# Patient Record
Sex: Male | Born: 1975 | Race: White | Hispanic: No | Marital: Single | Smoking: Current every day smoker
Health system: Southern US, Community
[De-identification: ages and names within clinical notes are randomized; demographics above are authoritative.]

## PROBLEM LIST (undated history)

## (undated) DIAGNOSIS — F32A Depression, unspecified: Secondary | ICD-10-CM

## (undated) DIAGNOSIS — F419 Anxiety disorder, unspecified: Secondary | ICD-10-CM

## (undated) DIAGNOSIS — I10 Essential (primary) hypertension: Secondary | ICD-10-CM

## (undated) HISTORY — DX: Depression, unspecified: F32.A

## (undated) HISTORY — PX: HERNIA REPAIR: SHX51

## (undated) HISTORY — DX: Anxiety disorder, unspecified: F41.9

---

## 2012-10-29 ENCOUNTER — Emergency Department: Payer: Self-pay | Admitting: Emergency Medicine

## 2012-10-29 LAB — COMPREHENSIVE METABOLIC PANEL
Anion Gap: 9 (ref 7–16)
BUN: 12 mg/dL (ref 7–18)
Bilirubin,Total: 0.5 mg/dL (ref 0.2–1.0)
Chloride: 102 mmol/L (ref 98–107)
Co2: 25 mmol/L (ref 21–32)
Creatinine: 0.64 mg/dL (ref 0.60–1.30)
EGFR (African American): >60
EGFR (Non-African Amer.): >60
Osmolality: 272 (ref 275–301)
Potassium: 3.8 mmol/L (ref 3.5–5.1)
SGPT (ALT): 53 U/L (ref 12–78)
Sodium: 136 mmol/L (ref 136–145)
Total Protein: 7.6 g/dL (ref 6.4–8.2)

## 2012-10-29 LAB — CBC
HCT: 44.4 % (ref 40.0–52.0)
MCV: 91 fL (ref 80–100)
Platelet: 297 10*3/uL (ref 150–440)
RBC: 4.87 10*6/uL (ref 4.40–5.90)
RDW: 13.1 % (ref 11.5–14.5)
WBC: 8.3 10*3/uL (ref 3.8–10.6)

## 2012-10-29 LAB — CK TOTAL AND CKMB (NOT AT ARMC)
CK, Total: 197 U/L (ref 35–232)
CK, Total: 192 U/L (ref 35–232)
CK-MB: 1.8 ng/mL (ref 0.5–3.6)

## 2013-02-21 ENCOUNTER — Inpatient Hospital Stay: Payer: Self-pay | Admitting: Family Medicine

## 2013-02-21 LAB — CBC
HCT: 44.8 % (ref 40.0–52.0)
HGB: 15.4 g/dL (ref 13.0–18.0)
MCH: 30.2 pg (ref 26.0–34.0)
MCV: 88 fL (ref 80–100)
Platelet: 242 10*3/uL (ref 150–440)
RBC: 5.1 10*6/uL (ref 4.40–5.90)
RDW: 13.9 % (ref 11.5–14.5)
WBC: 12.6 10*3/uL — ABNORMAL HIGH (ref 3.8–10.6)

## 2013-02-21 LAB — BASIC METABOLIC PANEL
Anion Gap: 7 (ref 7–16)
Chloride: 104 mmol/L (ref 98–107)
Co2: 28 mmol/L (ref 21–32)
EGFR (African American): >60
EGFR (Non-African Amer.): >60
Glucose: 126 mg/dL — ABNORMAL HIGH (ref 65–99)
Osmolality: 278 (ref 275–301)
Potassium: 3.2 mmol/L — ABNORMAL LOW (ref 3.5–5.1)
Sodium: 139 mmol/L (ref 136–145)

## 2013-02-21 LAB — CK TOTAL AND CKMB (NOT AT ARMC)
CK, Total: 317 U/L — ABNORMAL HIGH (ref 35–232)
CK-MB: 7.3 ng/mL — ABNORMAL HIGH (ref 0.5–3.6)

## 2013-02-21 LAB — TROPONIN I: Troponin-I: 2.76 ng/mL — ABNORMAL HIGH

## 2013-02-22 LAB — CK TOTAL AND CKMB (NOT AT ARMC)
CK, Total: 231 U/L (ref 35–232)
CK, Total: 152 U/L (ref 35–232)
CK-MB: 3.8 ng/mL — ABNORMAL HIGH (ref 0.5–3.6)
CK-MB: 2.7 ng/mL (ref 0.5–3.6)

## 2013-02-22 LAB — CBC WITH DIFFERENTIAL/PLATELET
Basophil #: 0.1 10*3/uL (ref 0.0–0.1)
Basophil %: 0.8 %
Eosinophil #: 0.1 10*3/uL (ref 0.0–0.7)
HCT: 41.4 % (ref 40.0–52.0)
HGB: 14.1 g/dL (ref 13.0–18.0)
Lymphocyte #: 2.3 10*3/uL (ref 1.0–3.6)
Lymphocyte %: 31.4 %
Neutrophil %: 52.4 %
Platelet: 226 10*3/uL (ref 150–440)
RDW: 14.1 % (ref 11.5–14.5)
WBC: 7.4 10*3/uL (ref 3.8–10.6)

## 2013-02-22 LAB — BASIC METABOLIC PANEL
Anion Gap: 6 — ABNORMAL LOW (ref 7–16)
BUN: 10 mg/dL (ref 7–18)
Co2: 29 mmol/L (ref 21–32)
EGFR (African American): >60
Glucose: 97 mg/dL (ref 65–99)
Osmolality: 284 (ref 275–301)
Potassium: 3.4 mmol/L — ABNORMAL LOW (ref 3.5–5.1)

## 2013-02-22 LAB — TROPONIN I
Troponin-I: 1.69 ng/mL — ABNORMAL HIGH
Troponin-I: 0.89 ng/mL — ABNORMAL HIGH

## 2013-02-22 LAB — APTT
Activated PTT: 54.4 secs — ABNORMAL HIGH (ref 23.6–35.9)
Activated PTT: 32.8 secs (ref 23.6–35.9)

## 2013-02-22 LAB — SEDIMENTATION RATE: Erythrocyte Sed Rate: 45 mm/hr — ABNORMAL HIGH (ref 0–15)

## 2013-02-23 LAB — BASIC METABOLIC PANEL
Anion Gap: 8 (ref 7–16)
BUN: 10 mg/dL (ref 7–18)
Calcium, Total: 8.7 mg/dL (ref 8.5–10.1)
Chloride: 106 mmol/L (ref 98–107)
Creatinine: 0.57 mg/dL — ABNORMAL LOW (ref 0.60–1.30)
Osmolality: 278 (ref 275–301)
Sodium: 140 mmol/L (ref 136–145)

## 2013-02-23 LAB — CBC WITH DIFFERENTIAL/PLATELET
Eosinophil #: 0.1 10*3/uL (ref 0.0–0.7)
HCT: 39.5 % — ABNORMAL LOW (ref 40.0–52.0)
MCH: 29.9 pg (ref 26.0–34.0)
MCHC: 33.7 g/dL (ref 32.0–36.0)
MCV: 89 fL (ref 80–100)
Monocyte %: 12.3 %
Neutrophil %: 63.5 %

## 2013-03-14 ENCOUNTER — Emergency Department: Payer: Self-pay | Admitting: Emergency Medicine

## 2013-03-26 ENCOUNTER — Emergency Department: Payer: Self-pay | Admitting: Emergency Medicine

## 2013-06-04 ENCOUNTER — Encounter: Payer: Self-pay | Admitting: Nurse Practitioner

## 2013-06-04 ENCOUNTER — Encounter: Payer: Self-pay | Admitting: Cardiothoracic Surgery

## 2013-06-22 ENCOUNTER — Encounter: Payer: Self-pay | Admitting: Nurse Practitioner

## 2013-06-22 ENCOUNTER — Encounter: Payer: Self-pay | Admitting: Cardiothoracic Surgery

## 2013-12-21 IMAGING — CR DG CHEST 2V
1 series · 2 of 2 positions shown · non-contrast
Comparison: none

REASON FOR EXAM: Chest Pain
COMMENTS:

[Series 1: w chest pa · 0.14mm/px · 2 of 2 slices shown]
[im 1/2]
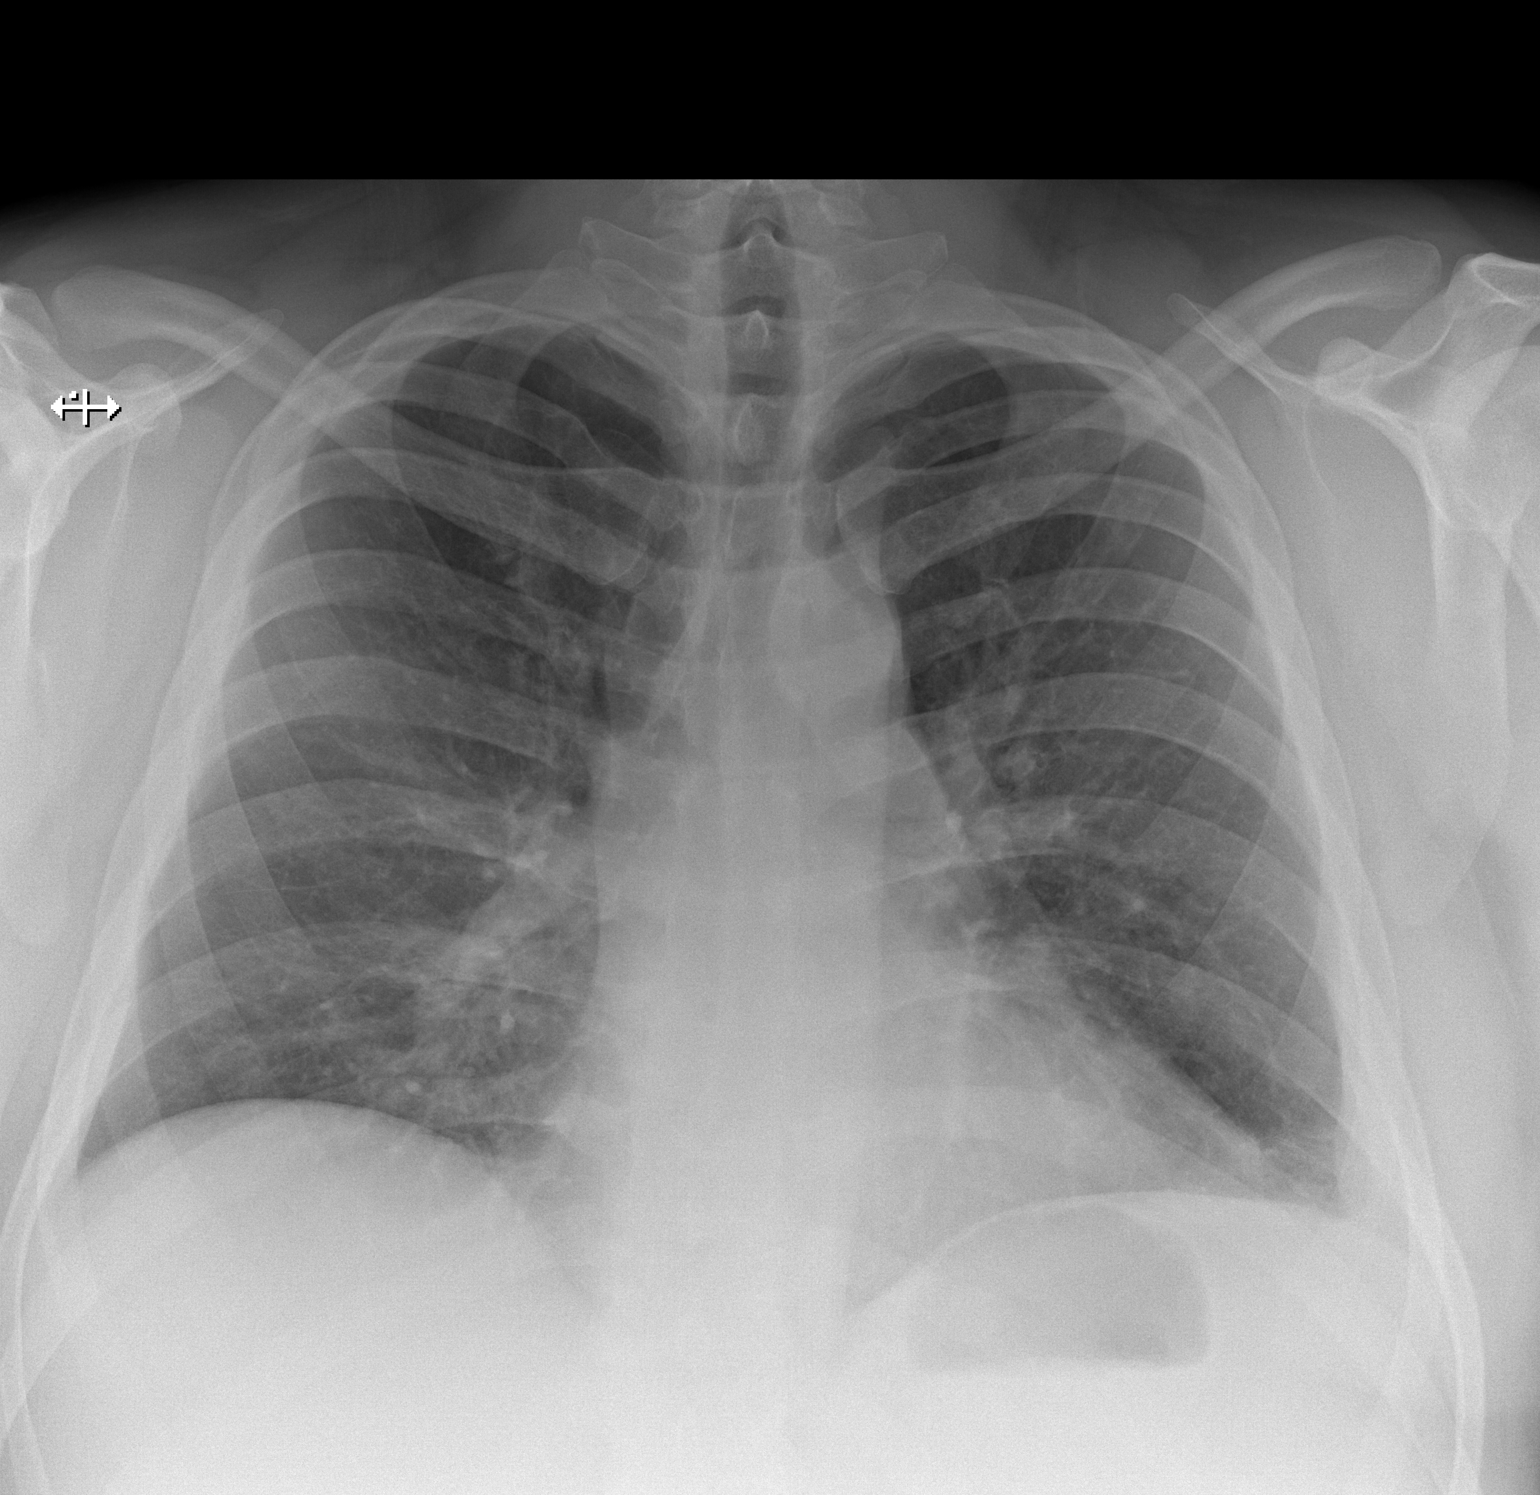
[im 2/2]
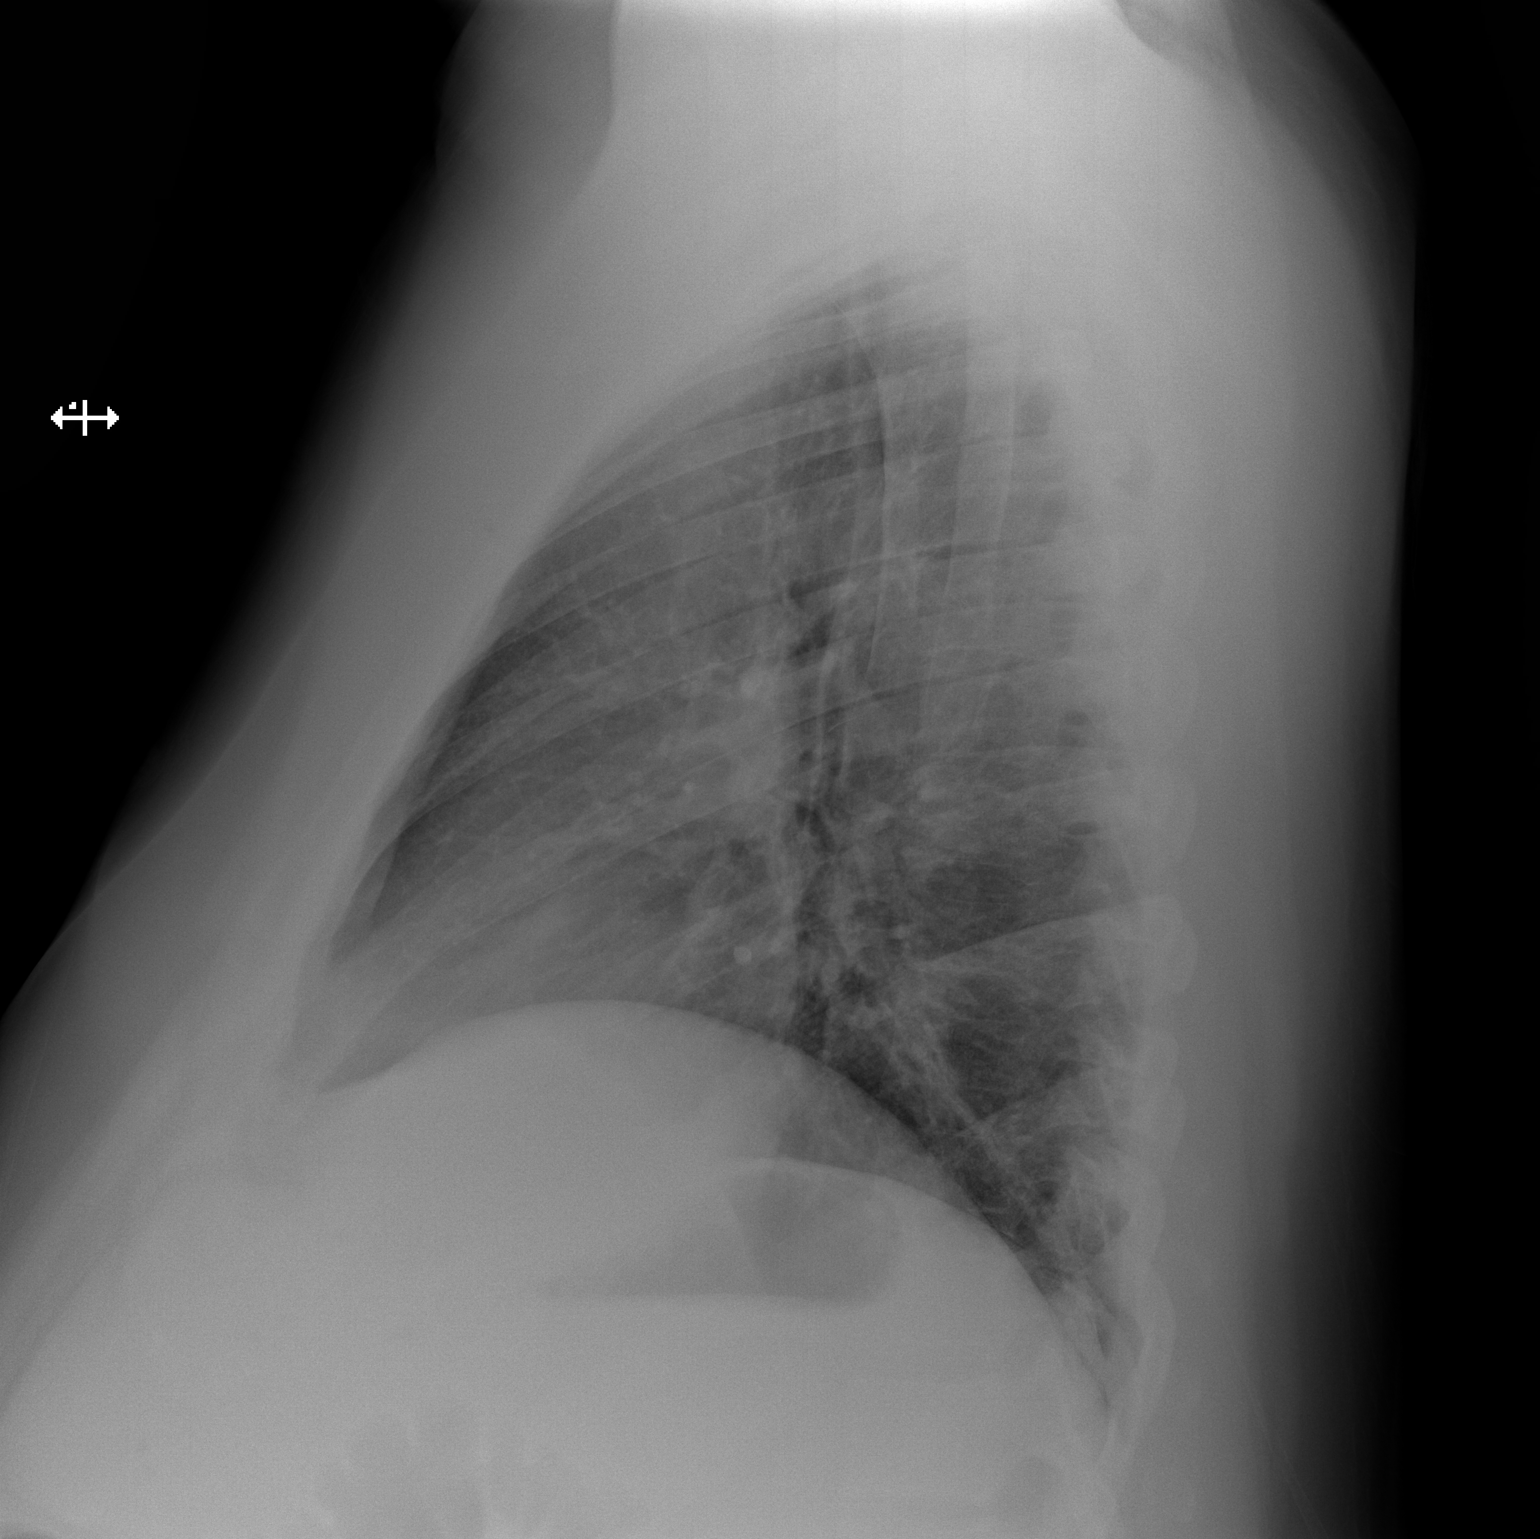

[2 of 2 positions shown; findings below may reference images not displayed]

PROCEDURE:     DXR - DXR CHEST PA (OR AP) AND LATERAL  - February 21, 2013  [DATE]

RESULT:     Comparison made to study October 29, 2012.

The lungs are adequately inflated. The perihilar lung markings are
increased. The central pulmonary vascularity is prominent. The cardiac
silhouette is normal in size. There is no pleural effusion or pneumothorax.
There is no alveolar infiltrate.
IMPRESSION: The findings may reflect low-grade interstitial edema of
cardiac or noncardiac cause. There is no alveolar pneumonia. There is no
evidence of a pneumothorax.

[REDACTED]

## 2014-12-31 ENCOUNTER — Ambulatory Visit: Payer: Self-pay | Admitting: Surgery

## 2015-01-01 ENCOUNTER — Ambulatory Visit: Payer: Self-pay | Admitting: Surgery

## 2015-01-07 ENCOUNTER — Ambulatory Visit: Payer: Self-pay | Admitting: Surgery

## 2015-03-14 NOTE — Discharge Summary (Signed)
PATIENT NAME:  [name], [name] MR#:  [id number] DATE OF BIRTH:  1976/02/09  DATE OF ADMISSION:  02/21/2013 DATE OF DISCHARGE: 02/23/2013   DISCHARGE DIAGNOSES:  1.  Chest discomfort with normal coronary arteries.  2.  Hypertension.  3.  Sleep apnea on CPAP.  4.  Gout.  5.  Anxiety/depression.   DISCHARGE MEDICATIONS:  1.  Flonase 50 mcg two sprays to each nostril daily.  2.  Risperdal 1 mg p.o. daily.  3.  Omeprazole 20 mg p.o. daily.  4.  Atenolol 50 mg p.o. daily.  5.  Citalopram 40 mg p.o. daily.  6.  Hydrochlorothiazide 25 mg p.o. daily.   CONSULTS: Cardiology per Dr. Lady GaryFath.   PROCEDURES: The patient underwent an echo that showed EF greater than 60%. He also underwent catheterization that showed normal coronary arteries.   LABORATORY, DIAGNOSTIC AND RADIOLOGICAL DATA: CK-MB of 3.8 transitioned to 2.7, troponin I 0.69 to 0.89. EKG did not show any significant changes. Sodium 140, potassium 3.6, creatinine 0.57. White blood cell count 8, hemoglobin 13.3, and platelets 259.   BRIEF HOSPITAL COURSE:  1.  Chest discomfort. The patient initially came in with chest discomfort with elevated troponins. His only risk factor was hypertension. He was evaluated by cardiology and underwent catheterization, which showed normal coronary arteries. He also had echocardiogram that showed normal LV function. Troponins were initially elevated, but did trend down. CK-MB was initially elevated and then started to trend down. He was asymptomatic on the next day. I do not think that this was a true cardiac event at this time. He will be followed as an outpatient per Dr. Dan HumphreysWalker. No further intervention at this time. No changes to his regimen.  2.  Other chronic medical issues remain stable at this time.   Discharged to home with followup Dr. Dan HumphreysWalker in 1 to 2 weeks.    ____________________________ Marisue IvanKanhka Daryle Amis, MD kl:aw D: 02/23/2013 08:32:58 ET T: 02/23/2013 08:40:02 ET JOB#: [id number]  cc: Marisue IvanKanhka  Kateryna Grantham, MD, <Dictator> John B. Danne HarborWalker, III, MD Marisue IvanKANHKA Musa Rewerts MD ELECTRONICALLY SIGNED 03/02/2013 10:01

## 2015-03-14 NOTE — H&P (Signed)
PATIENT NAME:  [name], CERASOLI MR#:  [id number] DATE OF BIRTH:  10-27-1976  DATE OF ADMISSION:  02/21/2013  REFERRING PHYSICIAN: Dr. Glenetta Hew.   FAMILY PHYSICIAN: Dr. Dan Humphreys.   REASON FOR ADMISSION: Chest pain with elevated troponin, consistent with non-ST elevation myocardial infarction.   HISTORY OF PRESENT ILLNESS: The patient is a 39 year old male with a significant history of anxiety/depression, sleep apnea, hypertension and gout. He was evaluated by Dr. Lady Gary in December with a routine stress test which was normal. Recently established with Dr. Dan Humphreys. He presents to the Emergency Room with chest pain associated with shortness of breath and diaphoresis. In the Emergency Room, the patient was noted to be tachycardic with diffuse non-ST elevation. There was some suggestion of an inferior infarct on EKG. His troponin returned elevated. He is currently chest pain-free and admitted for further evaluation.   PAST MEDICAL HISTORY: 1.  Benign hypertension.  2.  Sleep apnea, on CPAP.  3.  Gout.  4.  Anxiety/depression.  5.  Previous hernia repair.   MEDICATIONS: 1.  Atenolol 50 mg p.o. daily.  2.  Hydrochlorothiazide 25 mg p.o. daily.  3.  Omeprazole 20 mg p.o. daily.  4.  Risperdal 1 mg p.o. at bedtime.  5.  Celexa 40 mg p.o. daily.  6.  Flonase 2 puffs in each nostril daily.   ALLERGIES: VICODIN.   SOCIAL HISTORY: The patient is single, on disability because of his anxiety and depression. Smokes occasional cigars. No history of alcohol abuse.   FAMILY HISTORY: Positive coronary artery disease and "cancer." Otherwise unremarkable.   REVIEW OF SYSTEMS:  CONSTITUTIONAL: No fever or change in weight.  EYES: No blurred or double vision. No glaucoma.  ENT: No tinnitus or hearing loss.  NOSE:  No nasal discharge or bleeding.  THROAT:  No difficulty swallowing.  RESPIRATORY: No cough or wheezing. Denies hemoptysis.  CARDIOVASCULAR: No orthopnea. No palpitations or syncope.   GASTROINTESTINAL:  No nausea, vomiting or diarrhea. No abdominal pain. No change in bowel habits.  GENITOURINARY:  No dysuria or hematuria. No incontinence.  ENDOCRINE: No polyuria or polydipsia. No heat or cold intolerance.  HEMATOLOGIC: The patient denies anemia, easy bruising or bleeding.  LYMPHATIC: No swollen glands.  MUSCULOSKELETAL: The patient has pain in his neck, back, shoulders, knees, hips. Does have gout but no recent flares.  NEUROLOGIC: Denies migraines, stroke or seizures. No numbness.  PSYCHIATRIC: The patient admits to anxiety and depression. Has occasional problems with insomnia.   PHYSICAL EXAMINATION: GENERAL: The patient is in no acute distress.  VITAL SIGNS: Vital signs are currently remarkable for a blood pressure of 115/70 with a heart rate of 102, respiratory rate of 20. He is afebrile.  HEENT: Normocephalic, atraumatic. Pupils equally round and reactive to light and accommodation. Extraocular movements are intact. Sclerae anicteric. Conjunctivae are clear.  OROPHARYNX: Clear.  NECK: Supple without JVD. No adenopathy or thyromegaly is noted.  LUNGS: Clear to auscultation and percussion without wheezes, rales or rhonchi. No dullness. Respiratory effort is normal.  CARDIAC: Rapid rate with a regular rhythm. Normal S1 and S2. No significant rubs, murmurs or gallops. PMI is nondisplaced. Chest wall is slightly tender.  ABDOMEN: Soft, nontender with normoactive bowel sounds. No organomegaly or masses were appreciated. No hernias or bruits were noted.  EXTREMITIES: Without clubbing, cyanosis or edema. Pulses were 2+ bilaterally.  SKIN: Warm and dry without rash or lesions.  NEUROLOGIC: Cranial nerves II through XII grossly intact. Deep tendon reflexes were symmetric. Motor and sensory exam  is nonfocal.  PSYCH: The patient was alert and oriented to person, place and time. He was cooperative and used good judgment.   LABORATORY DATA: Chest CT was unremarkable. EKG in the  Emergency Room revealed sinus tachycardia at 110 beats per minute with Qs in III and aVF. There is some mild ST elevation noted throughout the precordium, specifically in leads V2 through V6. CBC was remarkable for a white count of 12.6 with a hemoglobin of 15.4. His sugar was 126 with a BUN of 9, creatinine 0.71, sodium 139, a potassium of 3.2. His total CK was 317 with an MB of 7.3 and a troponin of 2.76.   ASSESSMENT: 1.  Chest pain with elevated troponin, consistent with non-ST elevation myocardial infarction.  2.  Tachycardia.  3.  Leukocytosis.  4.  Hyperglycemia.  5.  Hypokalemia.  6.  Anxiety/depression.   PLAN: The patient will be admitted to telemetry with heparin, aspirin, beta blocker and topical nitrates. We will send off a sed rate stat. We will order serial cardiac enzymes as well as an echocardiogram. We will consult Dr. Lady GaryFath for possible cardiac catheterization. We will continue his outpatient regimen for now. Follow up routine labs in the morning. Further treatment and evaluation will depend upon the patient's progress.   TOTAL TIME SPENT ON THIS PATIENT: 50 minutes.    ____________________________ Duane LopeJeffrey D. Judithann SheenSparks, MD jds:cs D: 02/21/2013 20:41:00 ET T: 02/21/2013 20:50:31 ET JOB#: [id number]  cc: Duane LopeJeffrey D. Judithann SheenSparks, MD, <Dictator> John B. Danne HarborWalker III, MD Julen Rubert Rodena Medin Zylen Wenig MD ELECTRONICALLY SIGNED 02/22/2013 7:55

## 2015-03-14 NOTE — Consult Note (Signed)
   General Aspect Pt is a 39 yo male with no prior cardiac history who was admitted after several days of chest pain. 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 The pain does not appear to be secondary to exertion. It does seem to have a [positional aspect to it with some worsening when he reclines or lays on his left side. He has an abnormal serum troponiin to 2.79. EKG shows diffuse st elevation with borderline pr depression consistant with pericarditis. He is hemodynamically stable . Has strong family history of cad.   Physical Exam:  GEN well developed, well nourished, no acute distress   HEENT PERRL, hearing intact to voice   NECK supple   RESP clear BS   CARD Regular rate and rhythm  no rubs audible   ABD denies tenderness  normal BS   LYMPH negative neck, negative axillae   EXTR negative cyanosis/clubbing, negative edema   SKIN normal to palpation   NEURO cranial nerves intact, motor/sensory function intact   PSYCH A+O to time, place, person, anxious   Review of Systems:  Subjective/Chief Complaint chest pain and shortness of breath   General: No Complaints   Skin: No Complaints   ENT: No Complaints   Eyes: No Complaints   Neck: No Complaints   Respiratory: Short of breath   Cardiovascular: Chest pain or discomfort   Gastrointestinal: No Complaints   Genitourinary: No Complaints   Vascular: No Complaints   Musculoskeletal: No Complaints   Neurologic: No Complaints   Hematologic: No Complaints   Endocrine: No Complaints   Psychiatric: Anxiety   Review of Systems: All other systems were reviewed and found to be negative   EKG:  Interpretation diffuse st elevation    Vicodin: Unknown   Impression 39 yo male with history of hypertension and family history of cad admitted with several day history of chest pain with elevated serum troponin to 2.79. EKG suggests pericarditis as does the  positional componenet of his symptoms. Echo pending. Will review echo and further symtpoms and consider cardiac cath to evaluate for cad as etiology of chest pain.   Plan 1. Conitnue current meds. 2 Echo to evalauate wall motion and pericardium 3. Follow on telemetry 4. COnsider cardiac cath to evaluate coronary anatomy   Electronic Signatures: Dalia HeadingFath, Deyonna Fitzsimmons A (MD)  (Signed 03-Apr-14 09:31)  Authored: General Aspect/Present Illness, History and Physical Exam, Review of System, EKG , Allergies, Impression/Plan   Last Updated: 03-Apr-14 09:31 by Dalia HeadingFath, Satya Buttram A (MD)

## 2015-03-17 LAB — SURGICAL PATHOLOGY

## 2015-03-23 NOTE — Op Note (Signed)
PATIENT NAME:  Jorge FindersHAMLETT, [name] DATE OF BIRTH:  02-16-1976  DATE OF PROCEDURE:  01/07/2015  PREOPERATIVE DIAGNOSIS: Ventral hernia.   POSTOPERATIVE DIAGNOSIS: Incarcerated ventral hernia.   PROCEDURE: Ventral hernia repair with partial omentectomy.   SURGEON: Adella HareJ. Wilton Smith, MD    ANESTHESIA: General.   INDICATIONS: This 39 year old male has developed large bulge in the epigastrium. A ventral hernia was demonstrated on physical exam and recommended repair for definitive treatment. The patient was markedly and morbidly obese and operation lasted approximately 3 hours and 15 minutes.   ESTIMATED BLOOD LOSS: 20 mL.  PROCEDURE IN DETAIL: The patient was placed on the operating table in the supine position under general endotracheal anesthesia. The abdomen was prepared with ChloraPrep and draped in a sterile manner. A transversely oriented epigastric incision was made approximately 5 cm cephalad to the umbilicus, which was lengthened several times, ultimately up to 15 cm. There was a large portion of incarcerated omentum found within a ventral hernia sac. The sac was dissected free from surrounding structures. This was a somewhat tedious dissection. Multiple bleeding points were ligated with 3-0 and 4-0 chromic. Multiple suture ligatures were used. The circumferential dissection was carried out exposing the fascia. The omentum was incarcerated and the fascia was lengthened by incising it on both sides, so it was lengthened by about 2 cm on both sides. Next, it further appeared that even with this lengthening I could not reduce the omentum; therefore, the omentum was resected. It was serially clamped and suture ligated with 2-0 chromic and I performed omentectomy. I also removed the sac and did submit this for routine pathology. The field was inspected and determined that hemostasis was intact. Next, the fascial edges were further examined and dissected the properitoneal fat away from the  fascia circumferentially. Also, found that there was a small umbilical hernia inferior to this, which was with a defect of about 1 cm and did reduce some properitoneal fat and closed this fascial defect with a 0 Surgilon figure-of-eight suture. I also dissected the properitoneal fat back inferior to that, and the properitoneal fat was dissected away from the fascia circumferentially. Next, Bard soft mesh was used, a portion of mesh, which was some 8 x 4 cm, and placed in the properitoneal plane and sutured to the overlying fascia with through and through 0 Surgilon sutures. Next, the fascia was closed with a transversely oriented suture line of interrupted 0 Surgilon figure-of-eight sutures incorporating each suture into the mesh. The repair looked good. The wound was irrigated with warm saline solution. Hemostasis was intact. The tissues were closed with a 2-0 chromic pursestring suture, and then another pursestring suture on top of that. Next, the skin was closed with a running 3-0 Monocryl subcuticular suture and LiquiBand. The patient tolerated surgery satisfactorily and was then prepared for transfer to the recovery room.    ____________________________ Shela CommonsJ. Renda RollsWilton Smith, MD jws:bm D: 01/07/2015 19:20:40 ET T: 01/08/2015 01:32:22 ET JOB#: [id number]  cc: Adella HareJ. Wilton Smith, MD, <Dictator> Adella HareWILTON J SMITH MD ELECTRONICALLY SIGNED 01/08/2015 17:57

## 2015-03-31 ENCOUNTER — Encounter: Payer: Self-pay | Admitting: *Deleted

## 2015-03-31 DIAGNOSIS — T2102XA Burn of unspecified degree of abdominal wall, initial encounter: Secondary | ICD-10-CM | POA: Diagnosis present

## 2015-03-31 DIAGNOSIS — T2122XA Burn of second degree of abdominal wall, initial encounter: Secondary | ICD-10-CM | POA: Insufficient documentation

## 2015-03-31 DIAGNOSIS — Y93G2 Activity, grilling and smoking food: Secondary | ICD-10-CM | POA: Diagnosis not present

## 2015-03-31 DIAGNOSIS — L03311 Cellulitis of abdominal wall: Secondary | ICD-10-CM | POA: Diagnosis not present

## 2015-03-31 DIAGNOSIS — Y998 Other external cause status: Secondary | ICD-10-CM | POA: Insufficient documentation

## 2015-03-31 DIAGNOSIS — I10 Essential (primary) hypertension: Secondary | ICD-10-CM | POA: Insufficient documentation

## 2015-03-31 DIAGNOSIS — X18XXXA Contact with other hot metals, initial encounter: Secondary | ICD-10-CM | POA: Diagnosis not present

## 2015-03-31 DIAGNOSIS — Y9289 Other specified places as the place of occurrence of the external cause: Secondary | ICD-10-CM | POA: Diagnosis not present
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
## 2015-04-01 ENCOUNTER — Emergency Department
Admission: EM | Admit: 2015-04-01 | Discharge: 2015-04-01 | Disposition: A | Discharge status: Home / Self Care | Payer: Medicare Other | Attending: Emergency Medicine | Admitting: Emergency Medicine

## 2015-04-01 DIAGNOSIS — L03311 Cellulitis of abdominal wall: Secondary | ICD-10-CM

## 2015-04-01 DIAGNOSIS — IMO0002 Reserved for concepts with insufficient information to code with codable children: Secondary | ICD-10-CM

## 2015-04-01 DIAGNOSIS — T2122XA Burn of second degree of abdominal wall, initial encounter: Secondary | ICD-10-CM | POA: Diagnosis not present

## 2015-04-01 HISTORY — DX: Essential (primary) hypertension: I10

## 2015-04-01 MED ORDER — CEPHALEXIN 500 MG PO CAPS
500.0000 mg | ORAL_CAPSULE | Freq: Once | ORAL | Status: AC
  Administered 2015-04-01: 500 mg via ORAL

## 2015-04-01 MED ORDER — SILVER SULFADIAZINE 1 % EX CREA
TOPICAL_CREAM | CUTANEOUS | Status: AC
  Filled 2015-04-01: qty 85

## 2015-04-01 MED ORDER — SILVER SULFADIAZINE 1 % EX CREA: TOPICAL_CREAM | Freq: Two times a day (BID) | CUTANEOUS | Status: DC

## 2015-04-01 MED ORDER — SILVER SULFADIAZINE 1 % EX CREA
TOPICAL_CREAM | Freq: Two times a day (BID) | CUTANEOUS | Status: DC
  Administered 2015-04-01: 1 via TOPICAL

## 2015-04-01 MED ORDER — CEPHALEXIN 500 MG PO CAPS
ORAL_CAPSULE | ORAL | Status: AC
  Administered 2015-04-01: 500 mg via ORAL
  Filled 2015-04-01: qty 1

## 2015-04-01 MED ORDER — CEPHALEXIN 500 MG PO CAPS
ORAL_CAPSULE | ORAL | Status: AC
  Filled 2015-04-01: qty 1

## 2015-04-01 MED ORDER — CEPHALEXIN 500 MG PO CAPS: 500.0000 mg | ORAL_CAPSULE | Freq: Two times a day (BID) | ORAL | Status: DC

## 2015-04-01 NOTE — ED Notes (Signed)
Patient refused Keflex because he did not see RN open packet in front on him. Patient states, "I dont take what I dont see." RN explained to patient that medication was scanned and opened while in the room. Patient requesting another pill. Medication wasted and new pill obtained from pyxis.

## 2015-04-01 NOTE — Discharge Instructions (Signed)
Burn Care Your skin is a natural barrier to infection. It is the largest organ of your body. Burns damage this natural protection. To help prevent infection, it is very important to follow your caregiver's instructions in the care of your burn. Burns are classified as:  First degree. There is only redness of the skin (erythema). No scarring is expected.  Second degree. There is blistering of the skin. Scarring may occur with deeper burns.  Third degree. All layers of the skin are injured, and scarring is expected. HOME CARE INSTRUCTIONS   Wash your hands well before changing your bandage.  Change your bandage as often as directed by your caregiver.  Remove the old bandage. If the bandage sticks, you may soak it off with cool, clean water.  Cleanse the burn thoroughly but gently with mild soap and water.  Pat the area dry with a clean, dry cloth.  Apply a thin layer of antibacterial cream to the burn.  Apply a clean bandage as instructed by your caregiver.  Keep the bandage as clean and dry as possible.  Elevate the affected area for the first 24 hours, then as instructed by your caregiver.  Only take over-the-counter or prescription medicines for pain, discomfort, or fever as directed by your caregiver. SEEK IMMEDIATE MEDICAL CARE IF:   You develop excessive pain.  You develop redness, tenderness, swelling, or red streaks near the burn.  The burned area develops yellowish-white fluid (pus) or a bad smell.  You have a fever. MAKE SURE YOU:   Understand these instructions.  Will watch your condition.  Will get help right away if you are not doing well or get worse. Document Released: 11/08/2005 Document Revised: 01/31/2012 Document Reviewed: 03/31/2011 [employer] Patient Information 2015 [place]. This information is not intended to replace advice given to you by your health care provider. Make sure you discuss any questions you have with your health care  provider.  Cellulitis Cellulitis is an infection of the skin and the tissue beneath it. The infected area is usually red and tender. Cellulitis occurs most often in the arms and lower legs.  CAUSES  Cellulitis is caused by bacteria that enter the skin through cracks or cuts in the skin. The most common types of bacteria that cause cellulitis are staphylococci and streptococci. SIGNS AND SYMPTOMS   Redness and warmth.  Swelling.  Tenderness or pain.  Fever. DIAGNOSIS  Your health care provider can usually determine what is wrong based on a physical exam. Blood tests may also be done. TREATMENT  Treatment usually involves taking an antibiotic medicine. HOME CARE INSTRUCTIONS   Take your antibiotic medicine as directed by your health care provider. Finish the antibiotic even if you start to feel better.  Keep the infected arm or leg elevated to reduce swelling.  Apply a warm cloth to the affected area up to 4 times per day to relieve pain.  Take medicines only as directed by your health care provider.  Keep all follow-up visits as directed by your health care provider. SEEK MEDICAL CARE IF:   You notice red streaks coming from the infected area.  Your red area gets larger or turns dark in color.  Your bone or joint underneath the infected area becomes painful after the skin has healed.  Your infection returns in the same area or another area.  You notice a swollen bump in the infected area.  You develop new symptoms.  You have a fever. SEEK IMMEDIATE MEDICAL CARE IF:  You feel very sleepy.  You develop vomiting or diarrhea.  You have a general ill feeling (malaise) with muscle aches and pains. MAKE SURE YOU:   Understand these instructions.  Will watch your condition.  Will get help right away if you are not doing well or get worse. Document Released: 08/18/2005 Document Revised: 03/25/2014 Document Reviewed: 01/24/2012 [facility] Patient Information 2015  [place], [place]. This information is not intended to replace advice given to you by your health care provider. Make sure you discuss any questions you have with your health care provider.
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
## 2015-04-07 ENCOUNTER — Encounter: Payer: Self-pay | Admitting: Urgent Care

## 2015-04-07 ENCOUNTER — Emergency Department: Payer: Medicare Other

## 2015-04-07 DIAGNOSIS — J3489 Other specified disorders of nose and nasal sinuses: Secondary | ICD-10-CM | POA: Diagnosis not present

## 2015-04-07 DIAGNOSIS — R0600 Dyspnea, unspecified: Secondary | ICD-10-CM | POA: Insufficient documentation

## 2015-04-07 DIAGNOSIS — R0602 Shortness of breath: Secondary | ICD-10-CM | POA: Diagnosis not present

## 2015-04-07 DIAGNOSIS — R062 Wheezing: Secondary | ICD-10-CM | POA: Diagnosis present

## 2015-04-07 DIAGNOSIS — Z792 Long term (current) use of antibiotics: Secondary | ICD-10-CM | POA: Insufficient documentation

## 2015-04-07 DIAGNOSIS — R05 Cough: Secondary | ICD-10-CM | POA: Insufficient documentation

## 2015-04-07 DIAGNOSIS — I10 Essential (primary) hypertension: Secondary | ICD-10-CM | POA: Diagnosis not present

## 2015-04-07 LAB — BRAIN NATRIURETIC PEPTIDE: B NATRIURETIC PEPTIDE 5: 61 pg/mL (ref 0.0–100.0)

## 2015-04-07 LAB — CBC
HEMATOCRIT: 46.9 % (ref 40.0–52.0)
HEMOGLOBIN: 15.6 g/dL (ref 13.0–18.0)
MCH: 30.1 pg (ref 26.0–34.0)
MCHC: 33.3 g/dL (ref 32.0–36.0)
MCV: 90.3 fL (ref 80.0–100.0)
Platelets: 143 10*3/uL — ABNORMAL LOW (ref 150–440)
RBC: 5.2 MIL/uL (ref 4.40–5.90)
RDW: 13.7 % (ref 11.5–14.5)
WBC: 9.7 10*3/uL (ref 3.8–10.6)

## 2015-04-07 LAB — BASIC METABOLIC PANEL
Anion gap: 10 (ref 5–15)
BUN: 13 mg/dL (ref 6–20)
CO2: 23 mmol/L (ref 22–32)
Calcium: 9.3 mg/dL (ref 8.9–10.3)
Chloride: 105 mmol/L (ref 101–111)
Creatinine, Ser: 0.76 mg/dL (ref 0.61–1.24)
GFR calc Af Amer: >60 mL/min (ref >60)
GFR calc non Af Amer: >60 mL/min (ref >60)
GLUCOSE: 113 mg/dL — AB (ref 65–99)
Potassium: 4.1 mmol/L (ref 3.5–5.1)
Sodium: 138 mmol/L (ref 135–145)

## 2015-04-07 LAB — TROPONIN I
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
## 2015-04-08 ENCOUNTER — Emergency Department
Admission: EM | Admit: 2015-04-08 | Discharge: 2015-04-08 | Disposition: A | Discharge status: Home / Self Care | Payer: Medicare Other | Attending: Emergency Medicine | Admitting: Emergency Medicine

## 2015-04-08 DIAGNOSIS — R0600 Dyspnea, unspecified: Secondary | ICD-10-CM

## 2015-04-08 DIAGNOSIS — Z87898 Personal history of other specified conditions: Secondary | ICD-10-CM

## 2015-04-08 NOTE — ED Notes (Signed)
Patient with no complaints at this time. Respirations even and unlabored. Skin warm/dry. Discharge instructions reviewed with patient at this time. Patient given opportunity to voice concerns/ask questions. Patient discharged at this time and left Emergency Department with steady gait.   

## 2015-04-08 NOTE — Discharge Instructions (Signed)

## 2015-04-08 NOTE — ED Notes (Signed)
MD Webster at bedside, completing medical evaluation.  

## 2015-04-08 NOTE — ED Provider Notes (Signed)
[facility] Emergency Department Provider Note  ____________________________________________  Time seen: Approximately 0058 AM  I have reviewed the triage vital signs and the nursing notes.   HISTORY  Chief Complaint Shortness of Breath    HPI [name] is a 39 y.o. male who comes in with 2 weeks of wheezing and mild difficulty breathing. The patient reports that a couple of weeks ago friend of his who is a nurse noticed that he was wheezing. He reports that it resolved on its own but she was around him again today and said to him wheezing again. He reports that he did feel a little constricted in his chest and tightness in his breathing so his friend told him that he should come in to get checked out. The patient reports that he is unsure what may be causing this wheezing as he has had some runny nose and mild cough. He is unsure if this is allergies or anxiety. The patient reports on his way here his breathing did feel difficult but currently his breathing feels fine. The patient denies any chest pain. He denies any fevers. He was seen here recently for a burn on his abdomen and has had a recent hernia surgery. The patient is currently in no pain reports that the difficulty breathing and tightness improved without intervention.   Past Medical History  Diagnosis Date  . Hypertension     There are no active problems to display for this patient.   No past surgical history on file.  Current Outpatient Rx  Name  Route  Sig  Dispense  Refill  . cephALEXin (KEFLEX) 500 MG capsule   Oral   Take 1 capsule (500 mg total) by mouth 2 (two) times daily.   20 capsule   0   . silver sulfADIAZINE (SILVADENE) 1 % cream   Topical   Apply topically 2 (two) times daily.   50 g   0     Allergies Vicodin  No family history on file.  Social History History  Substance Use Topics  . Smoking status: Never Smoker   . Smokeless tobacco: Current User   Types: Chew  . Alcohol Use: No    Review of Systems Constitutional: No fever/chills Eyes: No visual changes. ENT: Mild runny nose, No sore throat. Cardiovascular: Denies chest pain. Respiratory: shortness of breath, mild cough Gastrointestinal: No abdominal pain.  No nausea, no vomiting.   Genitourinary: Negative for dysuria. Musculoskeletal: Negative for back pain. Skin: Burn Neurological: Negative for headaches, focal weakness or numbness. Hematological/Lymphatic:No swelling in his legs 10-point ROS otherwise negative.  ____________________________________________   PHYSICAL EXAM:  VITAL SIGNS: ED Triage Vitals  Enc Vitals Group     BP 04/07/15 2041 147/98 mmHg     Pulse Rate 04/07/15 2041 100     Resp 04/07/15 2041 24     Temp 04/07/15 2041 98.2 F (36.8 C)     Temp Source 04/07/15 2041 Oral     SpO2 04/07/15 2039 96 %     Weight 04/07/15 2041 370 lb (167.831 kg)     Height 04/07/15 2041 6\' 7"  (2.007 m)     Head Cir --      Peak Flow --      Pain Score 04/07/15 2103 0     Pain Loc --      Pain Edu? --      Excl. in GC? --     Constitutional: Alert and oriented. Well appearing and in no  acute distress. Eyes: Conjunctivae are normal. PERRL. EOMI. Head: Atraumatic. Nose: No congestion/rhinnorhea. Mouth/Throat: Mucous membranes are moist.  Oropharynx non-erythematous. Cardiovascular: Normal rate, regular rhythm. Grossly normal heart sounds.  Good peripheral circulation. Respiratory: Normal respiratory effort.  No retractions. Lungs CTAB. Gastrointestinal: Soft and nontender. No distention. Positive bowel sounds Genitourinary: Deferred Musculoskeletal: No lower extremity tenderness nor edema.   Neurologic:  Normal speech and language. No gross focal neurologic deficits are appreciated.  No gait instability. Skin:  Skin at the bottom of patient's feet are moist. Patient has an area of skin breakdown at the bottom of his right foot which has been there for over a  year and is nontender to palpation Psychiatric: Mood and affect are normal. Speech and behavior are normal.  ____________________________________________   LABS (all labs ordered are listed, but only abnormal results are displayed)  Labs Reviewed  CBC - Abnormal; Notable for the following:    Platelets 143 (*)    All other components within normal limits  BASIC METABOLIC PANEL - Abnormal; Notable for the following:    Glucose, Bld 113 (*)    All other components within normal limits  BRAIN NATRIURETIC PEPTIDE  TROPONIN I   ____________________________________________  EKG  ED ECG REPORT   Date: 04/08/2015  EKG Time: 2055  Rate: 85  Rhythm: normal EKG, normal sinus rhythm  Axis: Normal  Intervals:none  ST&T Change: None  ____________________________________________  RADIOLOGY  Chest x-ray: No acute pulmonary process ____________________________________________   PROCEDURES  Procedure(s) performed: None  Critical Care performed: No  ____________________________________________   INITIAL IMPRESSION / ASSESSMENT AND PLAN / ED COURSE  Pertinent labs & imaging results that were available during my care of the patient were reviewed by me and considered in my medical decision making (see chart for details).  The patient is 39 year old male who comes in with intermittent episodes of wheezing and shortness of breath has occurred 2 weeks apart. The patient reports that the symptoms have resolved at this time. The patient's heart enzymes and heart failure enzymes are unremarkable. The patient also does not have any abnormal chest x-ray findings. The patient's lung sounds are unremarkable and his oxygen saturations are unremarkable. The patient does have some symptoms of an upper respiratory infection which could be the cause of his wheezing intermittently. But at this time no cause is found for his wheezing and the patient is no longer wheezing. I will discharge the patient  home to follow-up with Dr. Dan HumphreysWalker his primary care physician. The patient's feet are moist and I did inform him that the area on the bottom of his right foot may be a cause of of foot fungal infection. I also did encourage the patient to follow-up with Dr. Dan HumphreysWalker for further treatment and to keep his feet dry and open to the air. The patient agrees with this plan and will be discharged home. The patient has no other concerns and is in no acute distress. ____________________________________________   FINAL CLINICAL IMPRESSION(S) / ED DIAGNOSES  Final diagnoses:  Wheezing  Dyspnea       Rebecka ApleyAllison P Webster, MD 04/08/15 [phone]

## 2016-09-09 DIAGNOSIS — F325 Major depressive disorder, single episode, in full remission: Secondary | ICD-10-CM | POA: Insufficient documentation

## 2017-03-23 ENCOUNTER — Emergency Department
Admission: EM | Admit: 2017-03-23 | Discharge: 2017-03-23 | Disposition: A | Discharge status: Home / Self Care | Payer: Medicare Other | Attending: Emergency Medicine | Admitting: Emergency Medicine

## 2017-03-23 ENCOUNTER — Encounter: Payer: Self-pay | Admitting: Emergency Medicine

## 2017-03-23 DIAGNOSIS — Y929 Unspecified place or not applicable: Secondary | ICD-10-CM | POA: Diagnosis not present

## 2017-03-23 DIAGNOSIS — S80861A Insect bite (nonvenomous), right lower leg, initial encounter: Secondary | ICD-10-CM | POA: Insufficient documentation

## 2017-03-23 DIAGNOSIS — Y999 Unspecified external cause status: Secondary | ICD-10-CM | POA: Diagnosis not present

## 2017-03-23 DIAGNOSIS — F17220 Nicotine dependence, chewing tobacco, uncomplicated: Secondary | ICD-10-CM | POA: Insufficient documentation

## 2017-03-23 DIAGNOSIS — I10 Essential (primary) hypertension: Secondary | ICD-10-CM | POA: Diagnosis not present

## 2017-03-23 DIAGNOSIS — Y939 Activity, unspecified: Secondary | ICD-10-CM | POA: Insufficient documentation

## 2017-03-23 DIAGNOSIS — W57XXXA Bitten or stung by nonvenomous insect and other nonvenomous arthropods, initial encounter: Secondary | ICD-10-CM | POA: Diagnosis not present

## 2017-03-23 NOTE — ED Triage Notes (Signed)
Patient ambulatory to triage with steady gait, without difficulty or distress noted; pt reports itching, stinging to right lower leg, abrasion noted to shin with scant bleeding

## 2017-03-23 NOTE — ED Provider Notes (Signed)
[facility] Emergency Department Provider Note   First MD Initiated Contact with Patient 03/23/17 0139     (approximate)  I have reviewed the triage vital signs and the nursing notes.   HISTORY  Chief Complaint Leg Injury    HPI [name] is a 41 y.o. male resents with concern of insect bites of the right lower leg. Patient states that he was driving when he felt a stinging sensation to his right lower leg. Patient states staining was then followed by pruritus. Patient admits to scratching the area which resulted in scant bleeding. Patient denies any pain at this timeno pruritus no stinging sensation. Patient denies any fever.   Past Medical History:  Diagnosis Date  . Hypertension     There are no active problems to display for this patient.   Past Surgical History:  Procedure Laterality Date  . HERNIA REPAIR      Prior to Admission medications   Medication Sig Start Date End Date Taking? Authorizing Provider  cephALEXin (KEFLEX) 500 MG capsule Take 1 capsule (500 mg total) by mouth 2 (two) times daily. 04/01/15   Darci Current, MD  silver sulfADIAZINE (SILVADENE) 1 % cream Apply topically 2 (two) times daily. 04/01/15   Darci Current, MD    Allergies Vicodin [hydrocodone-acetaminophen]  No family history on file.  Social History Social History  Substance Use Topics  . Smoking status: Never Smoker  . Smokeless tobacco: Current User    Types: Chew  . Alcohol use No    Review of Systems Constitutional: No fever/chills Eyes: No visual changes. ENT: No sore throat. Cardiovascular: Denies chest pain. Respiratory: Denies shortness of breath. Gastrointestinal: No abdominal pain.  No nausea, no vomiting.  No diarrhea.  No constipation. Genitourinary: Negative for dysuria. Musculoskeletal: Negative for back pain. Integumentary: Negative for rash. Neurological: Negative for headaches, focal weakness or  numbness.   ____________________________________________   PHYSICAL EXAM:  VITAL SIGNS: ED Triage Vitals  Enc Vitals Group     BP 03/23/17 0011 (!) 135/92     Pulse Rate 03/23/17 0011 (!) 101     Resp 03/23/17 0011 20     Temp 03/23/17 0011 98.9 F (37.2 C)     Temp Source 03/23/17 0011 Oral     SpO2 03/23/17 0011 97 %     Weight 03/23/17 0009 (!) 350 lb (158.8 kg)     Height 03/23/17 0009  (2.007 m)     Head Circumference --      Peak Flow --      Pain Score --      Pain Loc --      Pain Edu? --      Excl. in GC? --     Constitutional: Alert and oriented. Well appearing and in no acute distress. Eyes: Conjunctivae are normal. PERRL. EOMI. Head: Atraumatic. Mouth/Throat: Mucous membranes are moist.  Oropharynx non-erythematous. Neck: No stridor.  Gastrointestinal: Soft and nontender. No distention.  Musculoskeletal: No lower extremity tenderness nor edema. No gross deformities of extremities. Neurologic:   No gross focal neurologic deficits are appreciated.  Skin:  Excoriations and no active bleeding noted right lower extremity. No necrotic areas noted on the skin. Psychiatric: Mood and affect are normal. Speech and behavior are normal.  _  Procedures   ____________________________________________   INITIAL IMPRESSION / ASSESSMENT AND PLAN / ED COURSE  Pertinent labs & imaging results that were available during my care of the patient were reviewed  by me and considered in my medical decision making (see chart for details).  Area of excoriation noted right lower leg with no signs of infection. Spoke with the patient length regarding signs of infection including cellulitis with recommendation to return to the emergency Department if any of these should ensue.      ____________________________________________  FINAL CLINICAL IMPRESSION(S) / ED DIAGNOSES  Final diagnoses:  Insect bite, initial encounter     MEDICATIONS GIVEN DURING THIS  VISIT:  Medications - No data to display   NEW OUTPATIENT MEDICATIONS STARTED DURING THIS VISIT:  New Prescriptions   No medications on file    Modified Medications   No medications on file    Discontinued Medications   No medications on file     Note:  This document was prepared using Dragon voice recognition software and may include unintentional dictation errors.    Darci Current, MD 03/23/17 [phone]

## 2018-01-03 ENCOUNTER — Other Ambulatory Visit: Payer: Self-pay | Admitting: Internal Medicine

## 2018-01-03 DIAGNOSIS — N5089 Other specified disorders of the male genital organs: Secondary | ICD-10-CM

## 2018-01-20 ENCOUNTER — Ambulatory Visit: Payer: Medicare Other

## 2019-07-27 ENCOUNTER — Other Ambulatory Visit: Payer: Self-pay | Admitting: Internal Medicine

## 2019-07-27 DIAGNOSIS — N5089 Other specified disorders of the male genital organs: Secondary | ICD-10-CM

## 2019-08-03 ENCOUNTER — Other Ambulatory Visit: Payer: Self-pay

## 2019-08-03 ENCOUNTER — Ambulatory Visit
Admission: RE | Admit: 2019-08-03 | Discharge: 2019-08-03 | Disposition: A | Discharge status: Home / Self Care | Payer: Medicare Other | Source: Ambulatory Visit | Attending: Internal Medicine | Admitting: Internal Medicine

## 2019-08-03 DIAGNOSIS — N5089 Other specified disorders of the male genital organs: Secondary | ICD-10-CM | POA: Diagnosis present

## 2019-11-01 ENCOUNTER — Encounter: Payer: Self-pay | Admitting: Urology

## 2019-11-01 ENCOUNTER — Ambulatory Visit (INDEPENDENT_AMBULATORY_CARE_PROVIDER_SITE_OTHER): Payer: 59 | Admitting: Urology

## 2019-11-01 ENCOUNTER — Other Ambulatory Visit: Payer: Self-pay

## 2019-11-01 VITALS — BP 152/104 | HR 93 | Ht 79.0 in | Wt >= 6400 oz

## 2019-11-01 DIAGNOSIS — N503 Cyst of epididymis: Secondary | ICD-10-CM

## 2019-11-01 NOTE — Progress Notes (Signed)
   11/01/19 6:57 PM   Leonidas S Teng 09-14-1976 [id number]  Referring provider: Kirk Ruths, MD [place] [facility] [place],  [place] [address]  CC: Left epididymal cyst  HPI: I saw Mr. Cantrelle in urology clinic in consultation for a left epididymal cyst from Dr. Ouida Sills.  He is a healthy 43 year old male who reports an approximately 1 year duration of a palpable left cyst in the scrotum.  This is minimally bothersome and nontender.  He denies any urinary symptoms, weight loss, or family history of testicular cancer.  An ultrasound was ordered by his primary on 08/04/2019 and showed normal testicles bilaterally with no testicular mass, and a 1.2 cm benign-appearing left epididymal cyst.  He denies any weight loss or bone pain.   PMH: Past Medical History:  Diagnosis Date  . Hypertension     Surgical History: Past Surgical History:  Procedure Laterality Date  . HERNIA REPAIR      Allergies:  Allergies  Allergen Reactions  . Vicodin [Hydrocodone-Acetaminophen]     Family History: No family history on file.  Social History:  reports that he has never smoked. His smokeless tobacco use includes chew. He reports that he does not drink alcohol. No history on file for drug.  ROS: Please see flowsheet from today's date for complete review of systems.  Physical Exam: BP (!) 152/104   Pulse 93   Ht 6\' 7"  (2.007 m)   Wt (!) 418 lb (189.6 kg)   BMI 47.09 kg/m    Constitutional:  Alert and oriented, No acute distress. Cardiovascular: No clubbing, cyanosis, or edema. Respiratory: Normal respiratory effort, no increased work of breathing. GI: Abdomen is soft, nontender, nondistended, no abdominal masses GU: Testicles 20 cc and descended bilaterally, mobile and minimally tender left 1 cm epididymal cyst superior to the left testicle.  No skin lesions. Lymph: No cervical or inguinal lymphadenopathy. Skin: No rashes, bruises or suspicious  lesions. Neurologic: Grossly intact, no focal deficits, moving all 4 extremities. Psychiatric: Normal mood and affect.  Laboratory Data: Reviewed  Pertinent Imaging: Reviewed, see HPI  Assessment & Plan:   In summary, the patient is a healthy 43 year old male with a left epididymal cyst for over 1 year that is minimally bothersome.  This is benign appearing on recent scrotal ultrasound.  We discussed his scrotal ultrasound and physical exam findings at length, and I provided reassurance that this is a benign lesion.  We discussed treatment options if this were to become very tender or painful including NSAIDs, snug fitting underwear, icing as needed, or even surgical removal if it was extremely bothersome.  Follow-up as needed  A total of 40 minutes were spent face-to-face with the patient, greater than 50% was spent in patient education, counseling, and coordination of care regarding left epididymal cyst.   Billey Co, MD  [place] [address], [place], [place] [address] [phone]

## 2020-08-11 ENCOUNTER — Ambulatory Visit (INDEPENDENT_AMBULATORY_CARE_PROVIDER_SITE_OTHER): Payer: 59 | Admitting: Internal Medicine

## 2020-08-11 DIAGNOSIS — G4733 Obstructive sleep apnea (adult) (pediatric): Secondary | ICD-10-CM | POA: Diagnosis not present

## 2020-08-11 DIAGNOSIS — Z9989 Dependence on other enabling machines and devices: Secondary | ICD-10-CM

## 2020-08-11 DIAGNOSIS — Z7189 Other specified counseling: Secondary | ICD-10-CM | POA: Insufficient documentation

## 2020-08-11 NOTE — Progress Notes (Signed)
[facility] [address] [place], [place] [address]  Pulmonary Sleep Medicine   Office Visit Note  Patient Name: [name] DOB: 01/16/1976 MRN [id number]    Chief Complaint: Obstructive Sleep Apnea visit  Brief History:  [name] is seen today for follow up The patient has a 14 year history of sleep apnea. Patient is using PAP nightly.  The patient feels more rested after sleeping with PAP.  He is averaging almost 8 hours of use per night. He wakes once or more often to urinate. The patient reports benefiting from PAP use. Reported sleepiness is  improved and the Epworth Sleepiness Score is 7 out of 24. The patient rarely take naps. The patient complains of the following: no complaints  The compliance download shows excellent compliance with an average use time of 7.9 hours. The AHI is 1.3  The patient does not of limb movements disrupting sleep.  ROS  General: (-) fever, (-) chills, (-) night sweat Nose and Sinuses: (-) nasal stuffiness or itchiness, (-) postnasal drip, (-) nosebleeds, (-) sinus trouble. Mouth and Throat: (-) sore throat, (-) hoarseness. Neck: (-) swollen glands, (-) enlarged thyroid, (-) neck pain. Respiratory: - cough, - shortness of breath, - wheezing. Neurologic: - numbness, - tingling. Psychiatric: - anxiety, - depression   Current Medication: No outpatient encounter medications on file as of 08/11/2020.   No facility-administered encounter medications on file as of 08/11/2020.    Surgical History: Past Surgical History:  Procedure Laterality Date  . HERNIA REPAIR      Medical History: Past Medical History:  Diagnosis Date  . Anxiety   . Depression   . Hypertension     Family History: Non contributory to the present illness  Social History: Social History   Socioeconomic History  . Marital status: Single    Spouse name: Not on file  . Number of children: Not on file  . Years of education: Not on file  . Highest  education level: Not on file  Occupational History  . Not on file  Tobacco Use  . Smoking status: Current Every Day Smoker    Types: Pipe  . Smokeless tobacco: Current User    Types: Chew  Substance and Sexual Activity  . Alcohol use: No  . Drug use: Not on file  . Sexual activity: Not on file  Other Topics Concern  . Not on file  Social History Narrative  . Not on file   Social Determinants of Health   Financial Resource Strain:   . Difficulty of Paying Living Expenses: Not on file  Food Insecurity:   . Worried About Programme researcher, broadcasting/film/video in the Last Year: Not on file  . Ran Out of Food in the Last Year: Not on file  Transportation Needs:   . Lack of Transportation (Medical): Not on file  . Lack of Transportation (Non-Medical): Not on file  Physical Activity:   . Days of Exercise per Week: Not on file  . Minutes of Exercise per Session: Not on file  Stress:   . Feeling of Stress : Not on file  Social Connections:   . Frequency of Communication with Friends and Family: Not on file  . Frequency of Social Gatherings with Friends and Family: Not on file  . Attends Religious Services: Not on file  . Active Member of Clubs or Organizations: Not on file  . Attends Banker Meetings: Not on file  . Marital Status: Not on file  Intimate  Partner Violence:   . Fear of Current or Ex-Partner: Not on file  . Emotionally Abused: Not on file  . Physically Abused: Not on file  . Sexually Abused: Not on file    Vital Signs: Blood pressure (!) 178/104, pulse 95, height 6\' 7"  (2.007 m), weight (!) 432 lb (196 kg), SpO2 96 %.  Examination: General Appearance: The patient is well-developed, well-nourished, and in no distress. Neck Circumference: 52 Skin: Gross inspection of skin unremarkable. Head: normocephalic, no gross deformities. Eyes: no gross deformities noted. ENT: ears appear grossly normal Neurologic: Alert and oriented. No involuntary movements.    EPWORTH  SLEEPINESS SCALE:  Scale:  (0)= no chance of dozing; (1)= slight chance of dozing; (2)= moderate chance of dozing; (3)= high chance of dozing  Chance  Situtation    Sitting and reading: 1    Watching TV: 0    Sitting Inactive in public: 0    As a passenger in car: 0      Lying down to rest: 3    Sitting and talking: 1    Sitting quielty after lunch: 2    In a car, stopped in traffic: 0   TOTAL SCORE:   7 out of 24    SLEEP STUDIES:  1. PSG 11/2005 AHI 101 SpO4min 60%   CPAP COMPLIANCE DATA:  Date Range: 915/20-9/14/21  Average Daily Use: 7.9 hours  Median Use: 7.8  Compliance for > 4 Hours: 99 %  AHI: 1.3 respiratory events per hour  Days Used: 365/365  Mask Leak: 20.3  95th Percentile Pressure: 16.7/10.7         LABS: No results found for this or any previous visit (from the past 2160 hour(s)).  Radiology: 2161 SCROTUM W/DOPPLER  Result Date: 08/04/2019 CLINICAL DATA:  44 year old male with LEFT scrotal mass. EXAM: SCROTAL ULTRASOUND DOPPLER ULTRASOUND OF THE TESTICLES TECHNIQUE: Complete ultrasound examination of the testicles, epididymis, and other scrotal structures was performed. Color and spectral Doppler ultrasound were also utilized to evaluate blood flow to the testicles. COMPARISON:  None. FINDINGS: Right testicle Measurements: 5.1 x 2.8 x 3.6 cm. No mass or microlithiasis visualized. Left testicle Measurements: 5.6 x 2.4 x 3.6 cm. No mass or microlithiasis visualized. Right epididymis:  Normal in size and appearance. Left epididymis: A 1.2 cm LEFT epididymal cyst is noted, likely representing the patient's palpable abnormality. Hydrocele:  None visualized. Varicocele:  None visualized. Pulsed Doppler interrogation of both testes demonstrates normal low resistance arterial and venous waveforms bilaterally. IMPRESSION: 1. 1.2 cm LEFT epididymal cyst, likely representing the patient's palpable abnormality. 2. Normal testicles bilaterally. No  evidence of testicular mass or torsion. Electronically Signed   By: 52 M.D.   On: 08/04/2019 20:19    No results found.  No results found.    Assessment and Plan: Patient Active Problem List   Diagnosis Date Noted  . OSA on CPAP 08/11/2020  . CPAP use counseling 08/11/2020  . Morbid obesity (HCC) 08/11/2020      The patient does tolerate PAP and reports significant benefit from PAP use. The patient was reminded how to clean the unit and advised to replace the water chamber. The patient was also counselled on importance of weight loss in controlling his sleep apnea. He occasionally walkes. The compliance is excellent. The apnea is very well controlled.   1. OSA- continue excellent compliance and improve care of machine and supplies 2. Morbid obesity- watching diet and exercise. 3. CPAP counseling-Discussed proper use and cleaning  of CPAP machine.  General Counseling: I have discussed the findings of the evaluation and examination with Ford.  I have also discussed any further diagnostic evaluation thatmay be needed or ordered today. Yoseph verbalizes understanding of the findings of todays visit. We also reviewed his medications today and discussed drug interactions and side effects including but not limited excessive drowsiness and altered mental states. We also discussed that there is always a risk not just to him but also people around him. he has been encouraged to call the office with any questions or concerns that should arise related to todays visit.    I have personally obtained a history, examined the patient, evaluated laboratory and imaging results, formulated the assessment and plan and placed orders.  This patient was seen by Leeanne Deed AGNP-C in Collaboration with Dr. Freda Munro as a part of collaborative care agreement.  Valentino Hue Sol Blazing, PhD, FAASM  Diplomate, American Board of Sleep Medicine    Yevonne Pax, MD [facility] Diplomate ABMS Pulmonary and  Critical Care Medicine Sleep medicine

## 2020-08-11 NOTE — Patient Instructions (Signed)

## 2021-10-19 ENCOUNTER — Ambulatory Visit: Payer: 59

## 2021-10-19 VITALS — BP 163/103 | HR 93 | Resp 20 | Ht 79.0 in | Wt >= 6400 oz

## 2021-10-19 DIAGNOSIS — I10 Essential (primary) hypertension: Secondary | ICD-10-CM | POA: Insufficient documentation

## 2021-10-19 DIAGNOSIS — G4733 Obstructive sleep apnea (adult) (pediatric): Secondary | ICD-10-CM | POA: Insufficient documentation

## 2021-10-19 NOTE — Patient Instructions (Signed)

## 2021-10-19 NOTE — Progress Notes (Unsigned)
[facility] [address] [place], [place] [address]  Pulmonary Sleep Medicine   Office Visit Note  Patient Name: [name] DOB: 1976/01/19 MRN [id number]    Chief Complaint: Obstructive Sleep Apnea visit  Brief History:  [name] is seen today for annual follow up The patient has a 15 history of sleep apnea. Patient is using PAP nightly @ auto BiPAP.  The patient feels better after sleeping with PAP, when he can sleep.  The patient reports benefiting from PAP use. Reported sleepiness is  resolved  and the Epworth Sleepiness Score is 8 out of 24. Patient said he's been under a lot of stress recently. The patient does not take naps. The patient complains of the following: not sleeping due to noisy neighbors talking most late nights.   The compliance download shows  compliance with an average use time of 7:14 hours @ 99%. The AHI is 0.9  The patient does not complain of limb movements disrupting sleep.  ROS  General: (-) fever, (-) chills, (-) night sweat Nose and Sinuses: (-) nasal stuffiness or itchiness, (-) postnasal drip, (-) nosebleeds, (-) sinus trouble. Mouth and Throat: (-) sore throat, (-) hoarseness. Neck: (-) swollen glands, (-) enlarged thyroid, (-) neck pain. Respiratory: - cough, - shortness of breath, - wheezing. Neurologic: - numbness, - tingling. Psychiatric: - anxiety, - depression   Current Medication: No outpatient encounter medications on file as of 10/19/2021.   No facility-administered encounter medications on file as of 10/19/2021.    Surgical History: Past Surgical History:  Procedure Laterality Date   HERNIA REPAIR      Medical History: Past Medical History:  Diagnosis Date   Anxiety    Depression    Hypertension     Family History: Non contributory to the present illness  Social History: Social History   Socioeconomic History   Marital status: Single    Spouse name: Not on file   Number of children: Not on file    Years of education: Not on file   Highest education level: Not on file  Occupational History   Not on file  Tobacco Use   Smoking status: Every Day    Types: Pipe   Smokeless tobacco: Current    Types: Chew  Substance and Sexual Activity   Alcohol use: No   Drug use: Not on file   Sexual activity: Not on file  Other Topics Concern   Not on file  Social History Narrative   Not on file   Social Determinants of Health   Financial Resource Strain: Not on file  Food Insecurity: Not on file  Transportation Needs: Not on file  Physical Activity: Not on file  Stress: Not on file  Social Connections: Not on file  Intimate Partner Violence: Not on file    Vital Signs: Blood pressure (!) 163/103, pulse 93, resp. rate 20, height 6\' 7"  (2.007 m), weight (!) 468 lb (212.3 kg), SpO2 96 %. Body mass index is 52.72 kg/m.    Examination: General Appearance: The patient is well-developed, well-nourished, and in no distress. Neck Circumference: 50 cm Skin: Gross inspection of skin unremarkable. Head: normocephalic, no gross deformities. Eyes: no gross deformities noted. ENT: ears appear grossly normal Neurologic: Alert and oriented. No involuntary movements.    EPWORTH SLEEPINESS SCALE:  Scale:  (0)= no chance of dozing; (1)= slight chance of dozing; (2)= moderate chance of dozing; (3)= high chance of dozing  Chance  Situtation    Sitting and reading:  2    Watching TV: 0    Sitting Inactive in public: 1    As a passenger in car: 0      Lying down to rest: 3    Sitting and talking: 0    Sitting quielty after lunch: 2    In a car, stopped in traffic: 0   TOTAL SCORE:   8 out of 24    SLEEP STUDIES:  PSG 12/01/2005 - AHI 101 SpO28min 60%   CPAP COMPLIANCE DATA:  Date Range: 10/13/20 - 10/12/21  Average Daily Use: 7:14 hours  Median Use: 7:16 hours  Compliance for > 4 Hours: 99%  AHI: 0.9 respiratory events per hour  Days Used: 365/365  Mask Leak: 16  lpm  95th Percentile Pressure: Vauto max IPAP 23, min EPAP 10,  PS  6 cmH2O   LABS: No results found for this or any previous visit (from the past 2160 hour(s)).  Radiology: US SCROTUM W/DOPPLER  Result Date: 08/04/2019 CLINICAL DATA:  45 year old male with LEFT scrotal mass. EXAM: SCROTAL ULTRASOUND DOPPLER ULTRASOUND OF THE TESTICLES TECHNIQUE: Complete ultrasound examination of the testicles, epididymis, and other scrotal structures was performed. Color and spectral Doppler ultrasound were also utilized to evaluate blood flow to the testicles. COMPARISON:  None. FINDINGS: Right testicle Measurements: 5.1 x 2.8 x 3.6 cm. No mass or microlithiasis visualized. Left testicle Measurements: 5.6 x 2.4 x 3.6 cm. No mass or microlithiasis visualized. Right epididymis:  Normal in size and appearance. Left epididymis: A 1.2 cm LEFT epididymal cyst is noted, likely representing the patient's palpable abnormality. Hydrocele:  None visualized. Varicocele:  None visualized. Pulsed Doppler interrogation of both testes demonstrates normal low resistance arterial and venous waveforms bilaterally. IMPRESSION: 1. 1.2 cm LEFT epididymal cyst, likely representing the patient's palpable abnormality. 2. Normal testicles bilaterally. No evidence of testicular mass or torsion. Electronically Signed   By: Harmon Pier M.D.   On: 08/04/2019 20:19    No results found.  No results found.    Assessment and Plan: Patient Active Problem List   Diagnosis Date Noted   OSA treated with BiPAP 10/19/2021   Hypertension 10/19/2021   OSA on CPAP 08/11/2020   CPAP use counseling 08/11/2020   Morbid obesity (HCC) 08/11/2020   Morbid obesity with BMI of 40.0-44.9, adult (HCC) 09/09/2016   Depression, major, in remission (HCC) 09/09/2016    1. OSA treated with BiPAP The patient does tolerate PAP and reports  benefit from PAP use. The patient was reminded how to clean equipment and advised to replace supplies routinely. The  patient was also counselled on weight loss. The compliance is excellent. The AHI is 0.9.   OSA treated with Bipap. Continue with excellent compliance with pap. F/u one year.    2. Morbid obesity (HCC) Obesity Counseling: Had a lengthy discussion regarding patients BMI and weight issues. Patient was instructed on portion control as well as increased activity. Also discussed caloric restrictions with trying to maintain intake less than 2000 Kcal. Discussions were made in accordance with the 5As of weight management. Simple actions such as not eating late and if able to, taking a walk is suggested.   3. Hypertension, unspecified type. We discussed today's blood pressure. He will call his doctor today. He is asymptomatic. He understands the risks. He stopped his blood pressure medication and he will discuss this with his doctor.  Hypertension Counseling:   The following hypertensive lifestyle modification were recommended and discussed:  1. Limiting alcohol intake to  less than 1 oz/day of ethanol:(24 oz of beer or 8 oz of wine or 2 oz of 100-proof whiskey). 2. Take baby ASA 81 mg daily. 3. Importance of regular aerobic exercise and losing weight. 4. Reduce dietary saturated fat and cholesterol intake for overall cardiovascular health. 5. Maintaining adequate dietary potassium, calcium, and magnesium intake. 6. Regular monitoring of the blood pressure. 7. Reduce sodium intake to less than 100 mmol/day (less than 2.3 gm of sodium or less than 6 gm of sodium choride)     General Counseling: I have discussed the findings of the evaluation and examination with Koty.  I have also discussed any further diagnostic evaluation thatmay be needed or ordered today. Zackari verbalizes understanding of the findings of todays visit. We also reviewed his medications today and discussed drug interactions and side effects including but not limited excessive drowsiness and altered mental states. We also discussed that  there is always a risk not just to him but also people around him. he has been encouraged to call the office with any questions or concerns that should arise related to todays visit.  No orders of the defined types were placed in this encounter.       I have personally obtained a history, examined the patient, evaluated laboratory and imaging results, formulated the assessment and plan and placed orders. This patient was seen today by Emmaline Kluver, PA-C in collaboration with Dr. Freda Munro.   Yevonne Pax, MD [facility] Diplomate ABMS Pulmonary Critical Care Medicine and Sleep Medicine

## 2022-08-23 ENCOUNTER — Ambulatory Visit: Payer: 59 | Admitting: Internal Medicine

## 2022-08-24 ENCOUNTER — Ambulatory Visit (INDEPENDENT_AMBULATORY_CARE_PROVIDER_SITE_OTHER): Payer: 59 | Admitting: Internal Medicine

## 2022-08-24 VITALS — BP 167/113 | HR 99 | Resp 20 | Ht 79.0 in | Wt >= 6400 oz

## 2022-08-24 DIAGNOSIS — I10 Essential (primary) hypertension: Secondary | ICD-10-CM | POA: Diagnosis not present

## 2022-08-24 DIAGNOSIS — Z6841 Body Mass Index (BMI) 40.0 and over, adult: Secondary | ICD-10-CM

## 2022-08-24 DIAGNOSIS — Z7189 Other specified counseling: Secondary | ICD-10-CM | POA: Diagnosis not present

## 2022-08-24 DIAGNOSIS — G4733 Obstructive sleep apnea (adult) (pediatric): Secondary | ICD-10-CM | POA: Diagnosis not present

## 2022-08-24 NOTE — Patient Instructions (Signed)

## 2022-08-24 NOTE — Progress Notes (Signed)
[facility] [place], [place] [address]  Pulmonary Sleep Medicine   Office Visit Note  Patient Name: [name] DOB: 04-07-1976 MRN [id number]    Chief Complaint: Obstructive Sleep Apnea visit  Brief History:  [name] is seen today for a follow up visit for BiPAP auto@ IPAP max 23, EPAP min 10, PS min 6 cmH2O. The patient has a 16 year history of sleep apnea and is in need of a new BiPAP unit. Patient's current unit is severely damaged and beyond repair. Patient is using PAP nightly.  The patient feels rested after sleeping with PAP.  The patient reports benefiting from PAP use and states that he is unable to sleep without his BiPAP unit. Reported sleepiness is improved and the Epworth Sleepiness Score is 15 out of 24. The patient does occasionally  take naps. The patient complains of the following: broken device, in a loaner unit and needs a replacement.  The compliance download shows 100% compliance with an average use time of 7 hours 51 minutes. The AHI is 0.8.  The patient does not complain of limb movements disrupting sleep. The patient continues to require BiPAP therapy as a medical necessity in order to eliminate his central and obstructive sleep apneas. Patient's sleep apnea is severe.   ROS  General: (-) fever, (-) chills, (-) night sweat Nose and Sinuses: (-) nasal stuffiness or itchiness, (-) postnasal drip, (-) nosebleeds, (-) sinus trouble. Mouth and Throat: (-) sore throat, (-) hoarseness. Neck: (-) swollen glands, (-) enlarged thyroid, (-) neck pain. Respiratory: - cough, - shortness of breath, - wheezing. Neurologic: - numbness, - tingling. Psychiatric: - anxiety, - depression   Current Medication: No outpatient encounter medications on file as of 08/24/2022.   No facility-administered encounter medications on file as of 08/24/2022.    Surgical History: Past Surgical History:  Procedure Laterality Date   HERNIA REPAIR      Medical  History: Past Medical History:  Diagnosis Date   Anxiety    Depression    Hypertension     Family History: Non contributory to the present illness  Social History: Social History   Socioeconomic History   Marital status: Single    Spouse name: Not on file   Number of children: Not on file   Years of education: Not on file   Highest education level: Not on file  Occupational History   Not on file  Tobacco Use   Smoking status: Every Day    Types: Pipe   Smokeless tobacco: Current    Types: Chew  Substance and Sexual Activity   Alcohol use: No   Drug use: Not on file   Sexual activity: Not on file  Other Topics Concern   Not on file  Social History Narrative   Not on file   Social Determinants of Health   Financial Resource Strain: Not on file  Food Insecurity: Not on file  Transportation Needs: Not on file  Physical Activity: Not on file  Stress: Not on file  Social Connections: Not on file  Intimate Partner Violence: Not on file    Vital Signs: Blood pressure (!) 167/113, pulse 99, resp. rate 20, height 6\' 7"  (2.007 m), weight (!) 486 lb (220.4 kg), SpO2 96 %. Body mass index is 54.75 kg/m.    Examination: General Appearance: The patient is well-developed, well-nourished, and in no distress. Neck Circumference: 52cm Skin: Gross inspection of skin unremarkable. Head: normocephalic, no gross deformities. Eyes: no gross deformities  noted. ENT: ears appear grossly normal Neurologic: Alert and oriented. No involuntary movements.  STOP BANG RISK ASSESSMENT S (snore) Have you been told that you snore?     No   T (tired) Are you often tired, fatigued, or sleepy during the day?   YES  O (obstruction) Do you stop breathing, choke, or gasp during sleep? NO   P (pressure) Do you have or are you being treated for high blood pressure? YES   B (BMI) Is your body index greater than 35 kg/m? YES   A (age) Are you 50 years old or older? No   N (neck) Do you  have a neck circumference greater than 16 inches?   YES   G (gender) Are you a male? YES   TOTAL STOP/BANG "YES" ANSWERS 5       A STOP-Bang score of 2 or less is considered low risk, and a score of 5 or more is high risk for having either moderate or severe OSA. For people who score 3 or 4, doctors may need to perform further assessment to determine how likely they are to have OSA.         EPWORTH SLEEPINESS SCALE:  Scale:  (0)= no chance of dozing; (1)= slight chance of dozing; (2)= moderate chance of dozing; (3)= high chance of dozing  Chance  Situtation    Sitting and reading: 2    Watching TV: 2    Sitting Inactive in public: 2    As a passenger in car: 1      Lying down to rest: 3    Sitting and talking: 1    Sitting quielty after lunch: 3    In a car, stopped in traffic: 1   TOTAL SCORE:   15 out of 24    SLEEP STUDIES:  PSG (11/2005) AHI 101/hr, min SpO2 low 60% Titration (11/2005) CPAP intolerance, BiPAP @ 20/14 cmH2O w/ 4 lpm of O2.  Titration (01/2006) BiPAP@ 20/15 cmH2O w/ AHI 7/hr and 4 lpm of O2 Titration (11/2007) BiPAP@ 16/11 cmH2O w/o nocturnal O2.  Titration (01/2013) BiPAP auto@ min EPAP 10, max IPAP 23, min PS 6 Split Study (01/2013) AHI 110/hr, min SpO2 68%, BiPAP titration due to incomplete control on cpap.  Titration (01/2013) BiPAP auto@ min EPAP 10, max IPAP 23, min PS 6 cmH2O Titration )07/2013) BiPAP auto@ min EPAP 10, max IPAP 23, min PS 6 cmH2O   CPAP COMPLIANCE DATA:  Date Range: 08/18/2021-08/17/2022  Average Daily Use: 7 hours 51 minutes  Median Use: 7 hours 53 minutes  Compliance for > 4 Hours: 100%  AHI: 0.8 respiratory events per hour  Days Used: 365/365 days  Mask Leak: 11.6  95th Percentile Pressure: Max IPAP 23/ min EPAP 10, PS min 6 cmH2O         LABS: No results found for this or any previous visit (from the past 2160 hour(s)).  Radiology: US SCROTUM W/DOPPLER  Result Date: 08/04/2019 CLINICAL  DATA:  46 year old male with LEFT scrotal mass. EXAM: SCROTAL ULTRASOUND DOPPLER ULTRASOUND OF THE TESTICLES TECHNIQUE: Complete ultrasound examination of the testicles, epididymis, and other scrotal structures was performed. Color and spectral Doppler ultrasound were also utilized to evaluate blood flow to the testicles. COMPARISON:  None. FINDINGS: Right testicle Measurements: 5.1 x 2.8 x 3.6 cm. No mass or microlithiasis visualized. Left testicle Measurements: 5.6 x 2.4 x 3.6 cm. No mass or microlithiasis visualized. Right epididymis:  Normal in size and appearance. Left epididymis: A 1.2  cm LEFT epididymal cyst is noted, likely representing the patient's palpable abnormality. Hydrocele:  None visualized. Varicocele:  None visualized. Pulsed Doppler interrogation of both testes demonstrates normal low resistance arterial and venous waveforms bilaterally. IMPRESSION: 1. 1.2 cm LEFT epididymal cyst, likely representing the patient's palpable abnormality. 2. Normal testicles bilaterally. No evidence of testicular mass or torsion. Electronically Signed   By: Harmon Pier M.D.   On: 08/04/2019 20:19    No results found.  No results found.    Assessment and Plan: Patient Active Problem List   Diagnosis Date Noted   OSA treated with BiPAP 10/19/2021   Hypertension 10/19/2021   OSA on CPAP 08/11/2020   CPAP use counseling 08/11/2020   Morbid obesity (HCC) 08/11/2020   Morbid obesity with BMI of 40.0-44.9, adult (HCC) 09/09/2016   Depression, major, in remission (HCC) 09/09/2016      The patient does tolerate PAP and reports benefit from PAP use. The patient was reminded how to adjust mask fit and advised to change supplies regularly. The patient was also counselled on nightly sue. The compliance is excellent. The AHI is 0.8. The patient's machine is past end of life and must be replaced.   1. OSA treated with BiPAP continue excellent compliance. Follow up 30+ days after set up  2. CPAP use  counseling CPAP couseling-Discussed importance of adequate CPAP use as well as proper care and cleaning techniques of machine and all supplies.  3. Hypertension, unspecified type Very elevated in office, denies taking any BP meds currently and relates elevation to caffeine and driving stress. Advised to monitor closely and follow up with PCP. If not improving or symptoms arise then he should seek higher care.  4. Morbid obesity with BMI of 50.0-59.9, adult (HCC) Obesity Counseling: Had a lengthy discussion regarding patients BMI and weight issues. Patient was instructed on portion control as well as increased activity. Also discussed caloric restrictions with trying to maintain intake less than 2000 Kcal. Discussions were made in accordance with the 5As of weight management. Simple actions such as not eating late and if able to, taking a walk is suggested.    General Counseling: I have discussed the findings of the evaluation and examination with Anthem.  I have also discussed any further diagnostic evaluation thatmay be needed or ordered today. Ayan verbalizes understanding of the findings of todays visit. We also reviewed his medications today and discussed drug interactions and side effects including but not limited excessive drowsiness and altered mental states. We also discussed that there is always a risk not just to him but also people around him. he has been encouraged to call the office with any questions or concerns that should arise related to todays visit.  No orders of the defined types were placed in this encounter.       I have personally obtained a history, examined the patient, evaluated laboratory and imaging results, formulated the assessment and plan and placed orders.  This patient was seen by Lynn Ito, PA-C in collaboration with Dr. Freda Munro as a part of collaborative care agreement.  Yevonne Pax, MD [facility] Diplomate ABMS Pulmonary Critical Care Medicine and  Sleep Medicine

## 2022-08-28 NOTE — Progress Notes (Signed)
Patient was no-show for appointment.  The office staff will contact the patient for rescheduling follow-up. 

## 2023-06-08 DIAGNOSIS — Z1211 Encounter for screening for malignant neoplasm of colon: Secondary | ICD-10-CM | POA: Diagnosis not present

## 2023-06-08 DIAGNOSIS — Z1331 Encounter for screening for depression: Secondary | ICD-10-CM | POA: Diagnosis not present

## 2023-06-08 DIAGNOSIS — Z0000 Encounter for general adult medical examination without abnormal findings: Secondary | ICD-10-CM | POA: Diagnosis not present

## 2023-06-08 DIAGNOSIS — Z6841 Body Mass Index (BMI) 40.0 and over, adult: Secondary | ICD-10-CM | POA: Diagnosis not present

## 2023-06-08 DIAGNOSIS — R6889 Other general symptoms and signs: Secondary | ICD-10-CM | POA: Diagnosis not present

## 2023-06-08 DIAGNOSIS — G4733 Obstructive sleep apnea (adult) (pediatric): Secondary | ICD-10-CM | POA: Diagnosis not present

## 2023-06-08 DIAGNOSIS — F325 Major depressive disorder, single episode, in full remission: Secondary | ICD-10-CM | POA: Diagnosis not present

## 2023-06-08 DIAGNOSIS — R7303 Prediabetes: Secondary | ICD-10-CM | POA: Diagnosis not present

## 2023-06-08 DIAGNOSIS — Z599 Problem related to housing and economic circumstances, unspecified: Secondary | ICD-10-CM | POA: Diagnosis not present

## 2023-06-28 DIAGNOSIS — F4310 Post-traumatic stress disorder, unspecified: Secondary | ICD-10-CM | POA: Diagnosis not present

## 2023-07-29 LAB — COLOGUARD

## 2023-08-09 DIAGNOSIS — Z2821 Immunization not carried out because of patient refusal: Secondary | ICD-10-CM | POA: Diagnosis not present

## 2023-08-09 DIAGNOSIS — R6889 Other general symptoms and signs: Secondary | ICD-10-CM | POA: Diagnosis not present

## 2023-08-09 DIAGNOSIS — F325 Major depressive disorder, single episode, in full remission: Secondary | ICD-10-CM | POA: Diagnosis not present

## 2023-08-09 DIAGNOSIS — G4733 Obstructive sleep apnea (adult) (pediatric): Secondary | ICD-10-CM | POA: Diagnosis not present

## 2023-08-09 DIAGNOSIS — Z6841 Body Mass Index (BMI) 40.0 and over, adult: Secondary | ICD-10-CM | POA: Diagnosis not present

## 2023-08-09 DIAGNOSIS — R7303 Prediabetes: Secondary | ICD-10-CM | POA: Diagnosis not present

## 2023-12-25 DIAGNOSIS — Z1211 Encounter for screening for malignant neoplasm of colon: Secondary | ICD-10-CM | POA: Diagnosis not present

## 2024-01-02 LAB — COLOGUARD: COLOGUARD: NEGATIVE

## 2024-07-10 NOTE — Congregational Nurse Program (Signed)
  Dept: [phone]   Congregational Nurse Program Note  Date of Encounter: 07/10/2024  Patient asked about his [place] healthcare perks.  Provided handout with [place] perks.  Specifically patient interested in joining a gym and trying to lose weight and get fit. Patient has transportation through [employer] as a perk for his [facility] plan.  Nurse will keep encouraging patient to use the gym membership perk.  Past Medical History: Past Medical History:  Diagnosis Date   Anxiety    Depression    Hypertension     Encounter Details:  Community Questionnaire - 07/10/24 1350       Questionnaire   Ask client: Do you give verbal consent for me to treat you today? Yes    Student Assistance N/A    [facility] S[facility]  [facility]    Encounter Setting CN site    Population Status Unknown    Insurance Medicaid;Medicare    Insurance/Financial Assistance Referral N/A    Medication N/A    Medical Provider Yes    Screening Referrals Made N/A    Medical Referrals Made N/A    Medical Appointment Completed N/A    CNP Interventions Advocate/Support    Screenings CN Performed N/A    ED Visit Averted N/A    Life-Saving Intervention Made N/A

## 2024-08-16 LAB — GLUCOSE, POCT (MANUAL RESULT ENTRY): POC Glucose: 108 mg/dL — AB (ref 70–99)

## 2024-08-16 NOTE — Congregational Nurse Program (Addendum)
  Dept: [phone]   Congregational Nurse Program Note  Date of Encounter: 08/16/2024  Provided handout for decreasing salt intake and handout on hypertension. Patient is joining [employer] to increase exercise. Has been cooking in air fryer to help with healthier eating.  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 Provided with BP cuff and log for daily BP monitoring.  Past Medical History: Past Medical History:  Diagnosis Date   Anxiety    Depression    Hypertension     Encounter Details:  Community Questionnaire - 08/16/24 1100       Questionnaire   Ask client: Do you give verbal consent for me to treat you today? Yes    Student Assistance N/A    [facility] Served  [facility]    Encounter Setting CN site    Population Status Unknown    Insurance Medicare;Medicaid    Insurance/Financial Assistance Referral N/A    Medication N/A    Medical Provider Yes    Screening Referrals Made N/A    Medical Referrals Made N/A    Medical Appointment Completed N/A    CNP Interventions Advocate/Support    Screenings CN Performed Blood Pressure;Blood Glucose    ED Visit Averted N/A    Life-Saving Intervention Made N/A           Today's Vitals   08/16/24 1050  BP: (!) 162/97   There is no height or weight on file to calculate BMI.
